# Patient Record
Sex: Male | Born: 2000 | Race: Black or African American | Hispanic: No | Marital: Single | Smoking: Current some day smoker
Health system: Southern US, Community
[De-identification: ages and names within clinical notes are randomized; demographics above are authoritative.]

---
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
## 2018-10-29 IMAGING — US US ABDOMEN LIMITED
1 series · 14 of 25 positions shown · non-contrast
Comparison: None.

CLINICAL DATA: Initial evaluation for acute right upper quadrant
pain, nausea, vomiting.

EXAM:
ULTRASOUND ABDOMEN LIMITED RIGHT UPPER QUADRANT

[Series 1: us abdomen limited · 0.23mm/px · 14 of 48 slices shown]
[im 1/48]
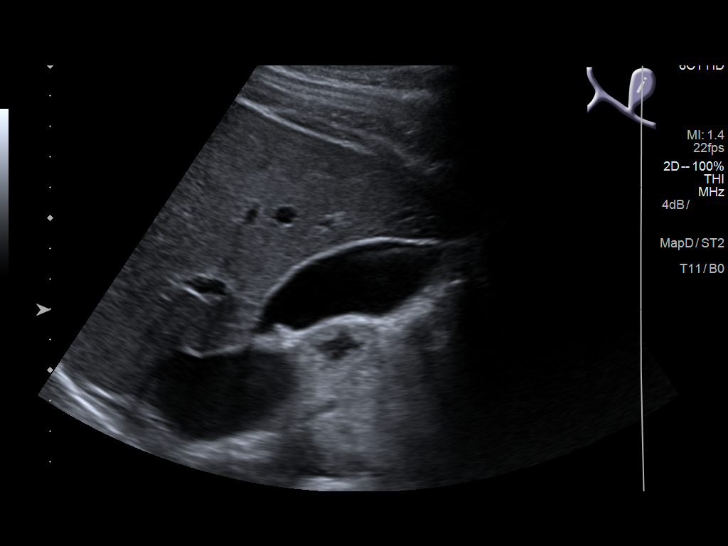
[im 4/48]
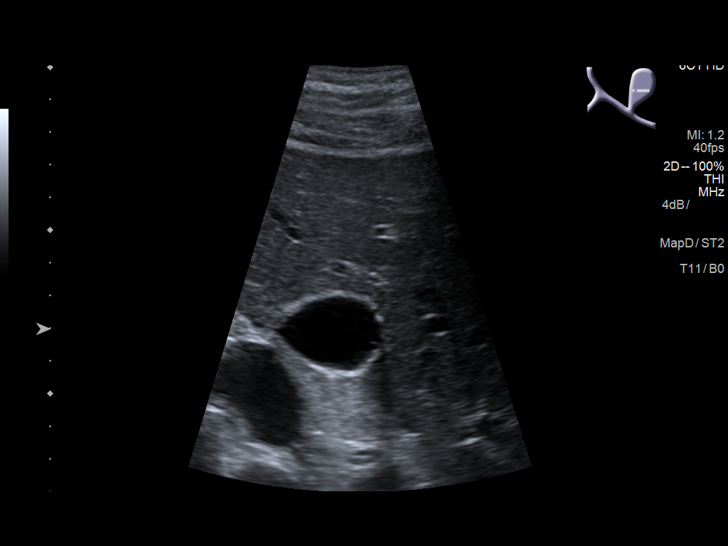
[im 8/48]
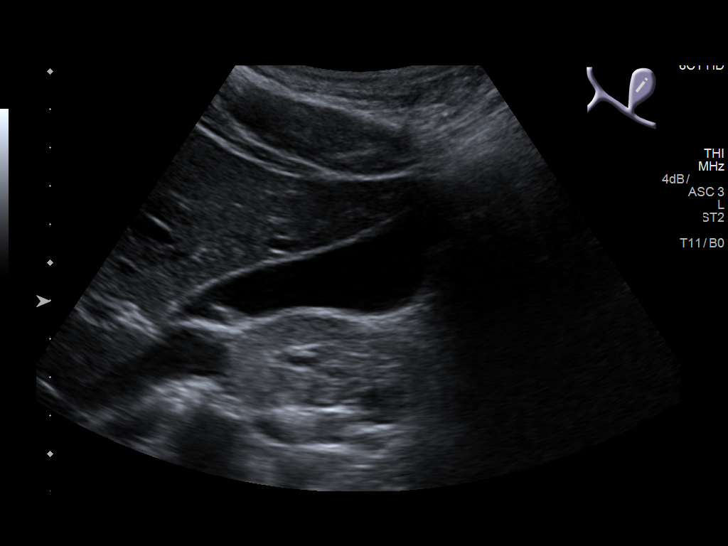
[im 12/48]
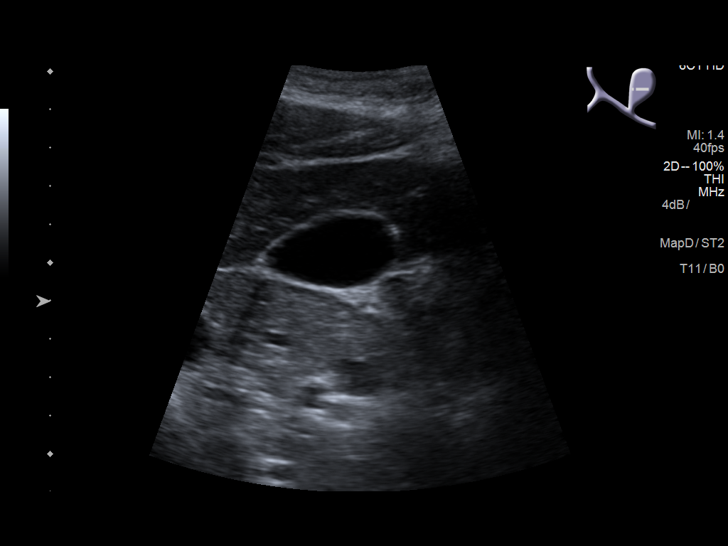
[im 16/48]
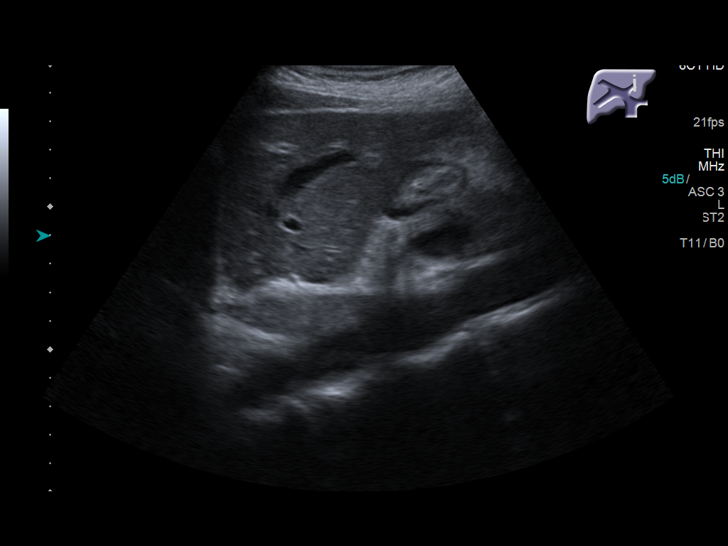
[im 18/48]
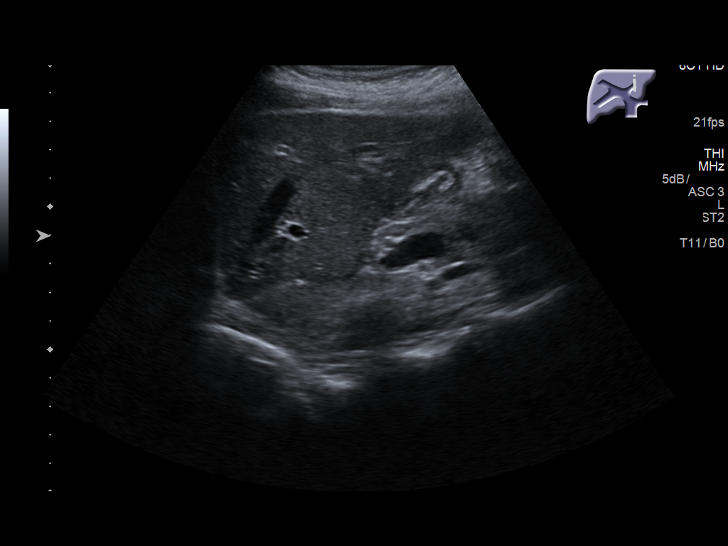
[im 22/48]
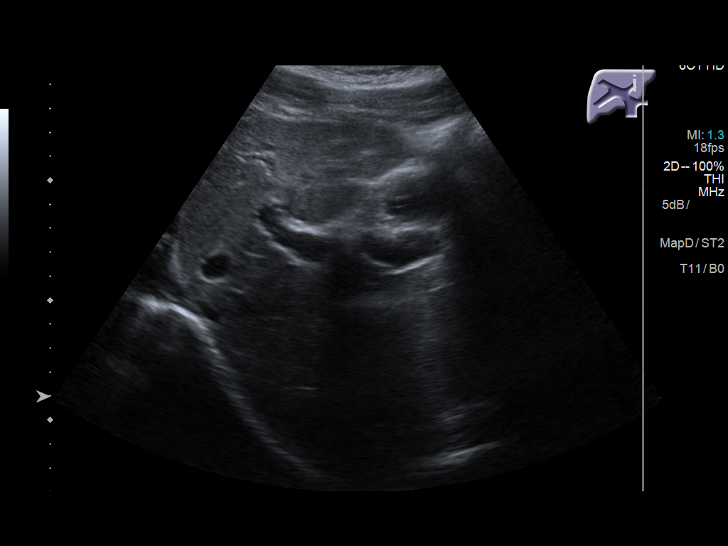
[im 26/48]
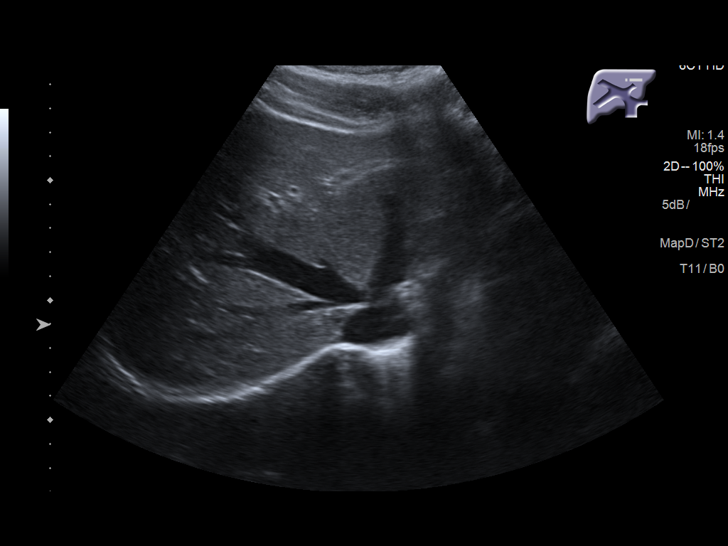
[im 30/48]
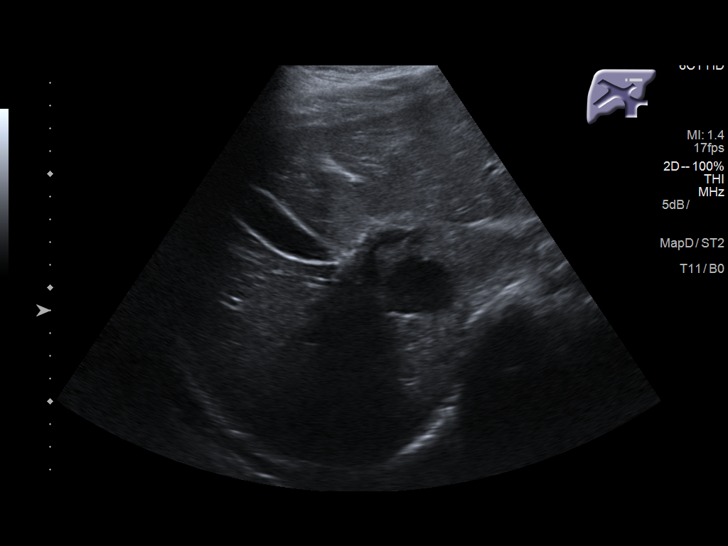
[im 32/48]
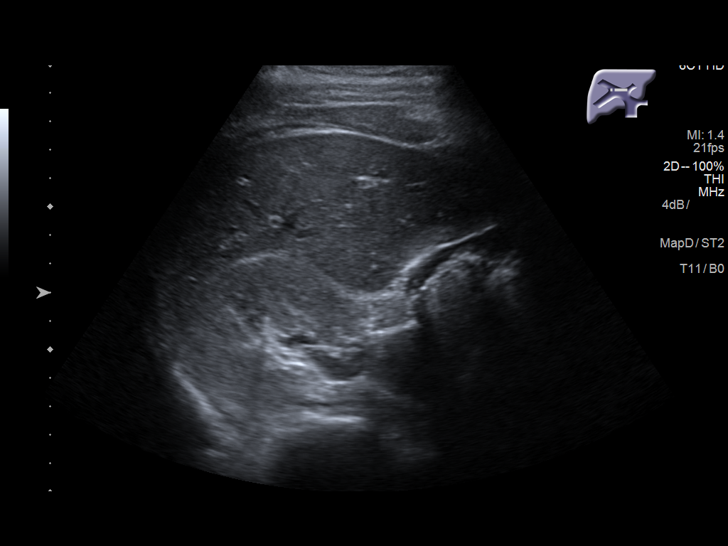
[im 36/48]
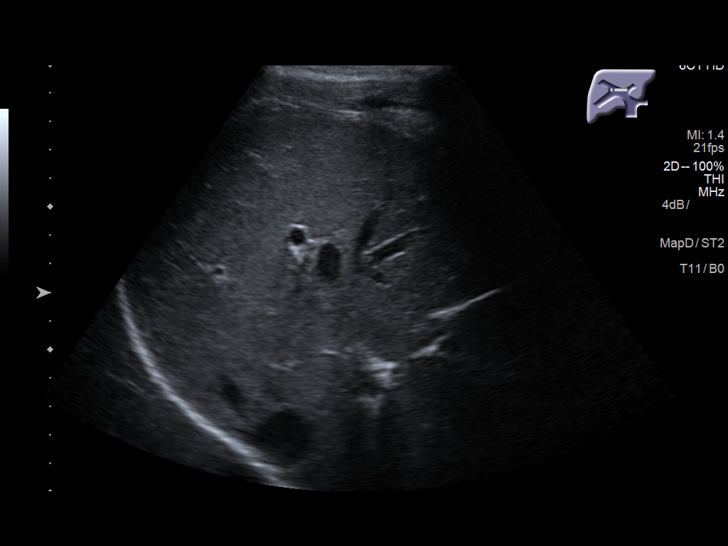
[im 40/48]
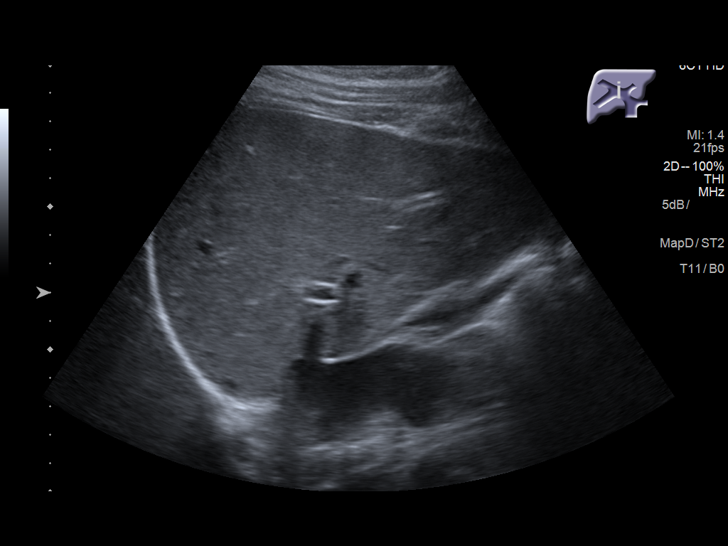
[im 44/48]
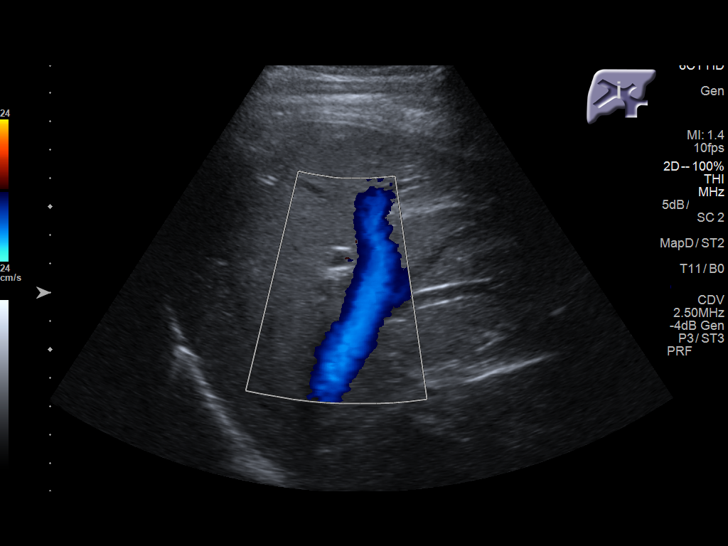
[im 48/48]
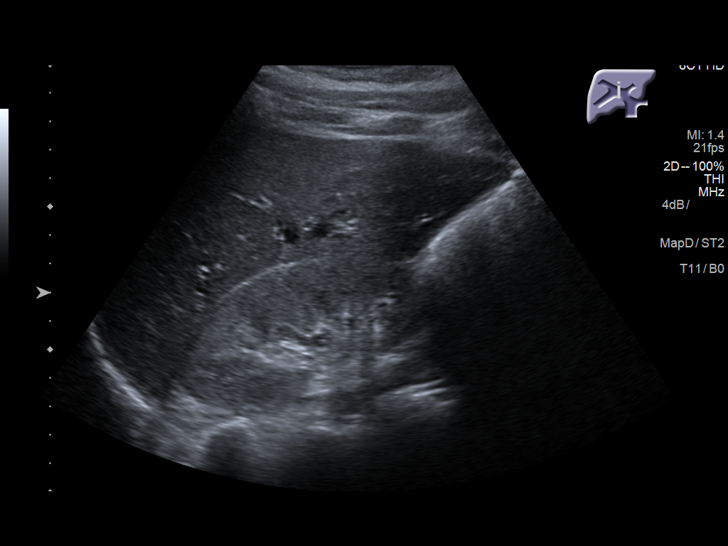

[14 of 25 positions shown; findings below may reference images not displayed]

FINDINGS: Gallbladder:

No gallstones or wall thickening visualized. No sonographic Murphy
sign noted by sonographer.

Common bile duct:

Diameter: 1.4 mm

Liver:

No focal lesion identified. Within normal limits in parenchymal
echogenicity. Portal vein is patent on color Doppler imaging with
normal direction of blood flow towards the liver.
IMPRESSION: Normal right upper quadrant ultrasound.

## 2019-10-04 ENCOUNTER — Other Ambulatory Visit: Payer: Self-pay

## 2019-10-04 DIAGNOSIS — Z20822 Contact with and (suspected) exposure to covid-19: Secondary | ICD-10-CM

## 2019-10-07 LAB — NOVEL CORONAVIRUS, NAA: SARS-CoV-2, NAA: NOT DETECTED

## 2024-03-20 ENCOUNTER — Emergency Department
Admission: EM | Admit: 2024-03-20 | Discharge: 2024-03-20 | Discharge status: Against Medical Advice | Payer: Self-pay | Attending: Emergency Medicine | Admitting: Emergency Medicine

## 2024-03-20 ENCOUNTER — Other Ambulatory Visit: Payer: Self-pay

## 2024-03-20 DIAGNOSIS — Y92410 Unspecified street and highway as the place of occurrence of the external cause: Secondary | ICD-10-CM | POA: Insufficient documentation

## 2024-03-20 DIAGNOSIS — M545 Low back pain, unspecified: Secondary | ICD-10-CM | POA: Insufficient documentation

## 2024-03-20 DIAGNOSIS — Z5321 Procedure and treatment not carried out due to patient leaving prior to being seen by health care provider: Secondary | ICD-10-CM | POA: Diagnosis not present
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
## 2024-11-06 ENCOUNTER — Emergency Department
Admission: EM | Admit: 2024-11-06 | Discharge: 2024-11-06 | Disposition: A | Discharge status: Home / Self Care | Attending: Emergency Medicine | Admitting: Emergency Medicine

## 2024-11-06 ENCOUNTER — Encounter: Payer: Self-pay | Admitting: Emergency Medicine

## 2024-11-06 DIAGNOSIS — R509 Fever, unspecified: Secondary | ICD-10-CM | POA: Diagnosis present

## 2024-11-06 DIAGNOSIS — J101 Influenza due to other identified influenza virus with other respiratory manifestations: Secondary | ICD-10-CM | POA: Insufficient documentation

## 2024-11-06 MED ORDER — ONDANSETRON 4 MG PO TBDP: 4.0000 mg | ORAL_TABLET | Freq: Three times a day (TID) | ORAL | 0 refills | Status: AC | PRN

## 2024-11-06 NOTE — ED Triage Notes (Signed)
 Pt reports laying in bed since Sunday. Endorses chills, vomiting, chest pains only when coughing. Friends were sick but states different symptoms.

## 2024-11-06 NOTE — ED Provider Notes (Signed)
" ° °  [facility] Provider Note    Event Date/Time   First MD Initiated Contact with Patient 11/06/24 [phone]     (approximate)   History   Emesis and Fever   HPI  Jose Walton is a 24 y.o. male who presents with complaints of fever, nausea, body aches, headache, sore throat over the last 2 days.  No abdominal pain     Physical Exam   Triage Vital Signs: ED Triage Vitals  Encounter Vitals Group     BP 11/06/24 0930 (!) 140/82     Girls Systolic BP Percentile --      Girls Diastolic BP Percentile --      Boys Systolic BP Percentile --      Boys Diastolic BP Percentile --      Pulse Rate 11/06/24 0930 89     Resp 11/06/24 0930 16     Temp 11/06/24 0930 (!) 100.6 F (38.1 C)     Temp Source 11/06/24 0930 Oral     SpO2 11/06/24 0930 96 %     Weight 11/06/24 0931 91 kg (200 lb 9.9 oz)     Height 11/06/24 0931 1.93 m (6' 4)     Head Circumference --      Peak Flow --      Pain Score 11/06/24 0929 7     Pain Loc --      Pain Education --      Exclude from Growth Chart --     Most recent vital signs: Vitals:   11/06/24 0930  BP: (!) 140/82  Pulse: 89  Resp: 16  Temp: (!) 100.6 F (38.1 C)  SpO2: 96%     General: Awake, no distress.  CV:  Good peripheral perfusion.  Resp:  Normal effort.  Clear to auscultation bilaterally Abd:  No distention.  Soft, nontender Other:     ED Results / Procedures / Treatments   Labs (all labs ordered are listed, but only abnormal results are displayed) Labs Reviewed - No data to display   EKG     RADIOLOGY     PROCEDURES:  Critical Care performed:   Procedures   MEDICATIONS ORDERED IN ED: Medications - No data to display   IMPRESSION / MDM / ASSESSMENT AND PLAN / ED COURSE  I reviewed the triage vital signs and the nursing notes. Patient's presentation is most consistent with acute illness / injury with system symptoms.  Patient presents with symptoms consistent with influenza  which is prevalent in the community at this time.  His primary complaint is nausea, otherwise exam is reassuring, will treat with ODT Zofran , recommend supportive care, strict return precautions, he agrees to this plan.        FINAL CLINICAL IMPRESSION(S) / ED DIAGNOSES   Final diagnoses:  Influenza A     Rx / DC Orders   ED Discharge Orders          Ordered    ondansetron  (ZOFRAN -ODT) 4 MG disintegrating tablet  Every 8 hours PRN        11/06/24 0950             Note:  This document was prepared using Dragon voice recognition software and may include unintentional dictation errors.   Arlander Charleston, MD 11/06/24 1210  "
